# Patient Record
Sex: Female | Born: 1945 | Race: White | Hispanic: No | Marital: Married | Smoking: Former smoker
Health system: Southern US, Community
[De-identification: ages and names within clinical notes are randomized; demographics above are authoritative.]

## PROBLEM LIST (undated history)

## (undated) DIAGNOSIS — I10 Essential (primary) hypertension: Secondary | ICD-10-CM

## (undated) DIAGNOSIS — E119 Type 2 diabetes mellitus without complications: Secondary | ICD-10-CM

## (undated) DIAGNOSIS — E785 Hyperlipidemia, unspecified: Secondary | ICD-10-CM

## (undated) HISTORY — DX: Essential (primary) hypertension: I10

## (undated) HISTORY — DX: Type 2 diabetes mellitus without complications: E11.9

## (undated) HISTORY — DX: Hyperlipidemia, unspecified: E78.5

---

## 1981-10-22 HISTORY — PX: TUBAL LIGATION: SHX77

## 2005-08-23 ENCOUNTER — Ambulatory Visit: Payer: Self-pay | Admitting: Family Medicine

## 2005-09-01 ENCOUNTER — Ambulatory Visit: Payer: Self-pay | Admitting: Unknown Physician Specialty

## 2005-09-22 ENCOUNTER — Ambulatory Visit: Payer: Self-pay | Admitting: Unknown Physician Specialty

## 2006-09-05 ENCOUNTER — Ambulatory Visit: Payer: Self-pay

## 2007-09-11 ENCOUNTER — Ambulatory Visit: Payer: Self-pay

## 2008-09-14 ENCOUNTER — Ambulatory Visit: Payer: Self-pay

## 2008-09-23 ENCOUNTER — Ambulatory Visit: Payer: Self-pay | Admitting: Certified Nurse Midwife

## 2009-09-26 ENCOUNTER — Ambulatory Visit: Payer: Self-pay

## 2010-07-13 ENCOUNTER — Ambulatory Visit: Payer: Self-pay | Admitting: Gastroenterology

## 2010-09-27 ENCOUNTER — Ambulatory Visit: Payer: Self-pay

## 2010-11-16 ENCOUNTER — Ambulatory Visit: Payer: Self-pay

## 2011-10-02 ENCOUNTER — Ambulatory Visit: Payer: Self-pay

## 2012-10-02 ENCOUNTER — Ambulatory Visit: Payer: Self-pay

## 2013-10-05 ENCOUNTER — Ambulatory Visit: Payer: Self-pay

## 2013-10-21 ENCOUNTER — Ambulatory Visit: Payer: Self-pay

## 2013-10-22 HISTORY — PX: ROTATOR CUFF REPAIR: SHX139

## 2013-10-29 ENCOUNTER — Ambulatory Visit: Payer: Self-pay | Admitting: Unknown Physician Specialty

## 2013-11-26 ENCOUNTER — Ambulatory Visit: Payer: Self-pay | Admitting: Anesthesiology

## 2013-11-27 ENCOUNTER — Ambulatory Visit: Payer: Self-pay | Admitting: Unknown Physician Specialty

## 2013-12-29 ENCOUNTER — Encounter: Payer: Self-pay | Admitting: Unknown Physician Specialty

## 2014-01-20 ENCOUNTER — Encounter: Payer: Self-pay | Admitting: Unknown Physician Specialty

## 2014-02-19 ENCOUNTER — Encounter: Payer: Self-pay | Admitting: Unknown Physician Specialty

## 2014-05-06 DIAGNOSIS — M75100 Unspecified rotator cuff tear or rupture of unspecified shoulder, not specified as traumatic: Secondary | ICD-10-CM | POA: Insufficient documentation

## 2014-06-02 DIAGNOSIS — E119 Type 2 diabetes mellitus without complications: Secondary | ICD-10-CM | POA: Insufficient documentation

## 2014-10-07 ENCOUNTER — Ambulatory Visit: Payer: Self-pay

## 2015-04-20 ENCOUNTER — Ambulatory Visit (INDEPENDENT_AMBULATORY_CARE_PROVIDER_SITE_OTHER): Payer: Medicare Other | Admitting: Urology

## 2015-04-20 ENCOUNTER — Encounter: Payer: Self-pay | Admitting: Urology

## 2015-04-20 VITALS — BP 124/69 | HR 77 | Ht 66.0 in | Wt 190.0 lb

## 2015-04-20 DIAGNOSIS — I10 Essential (primary) hypertension: Secondary | ICD-10-CM | POA: Insufficient documentation

## 2015-04-20 DIAGNOSIS — N390 Urinary tract infection, site not specified: Secondary | ICD-10-CM

## 2015-04-20 DIAGNOSIS — N8111 Cystocele, midline: Secondary | ICD-10-CM

## 2015-04-20 DIAGNOSIS — E782 Mixed hyperlipidemia: Secondary | ICD-10-CM | POA: Insufficient documentation

## 2015-04-20 DIAGNOSIS — R8281 Pyuria: Secondary | ICD-10-CM | POA: Insufficient documentation

## 2015-04-20 LAB — URINALYSIS, COMPLETE
Bilirubin, UA: NEGATIVE
GLUCOSE, UA: NEGATIVE
KETONES UA: NEGATIVE
Nitrite, UA: NEGATIVE
PROTEIN UA: NEGATIVE
Specific Gravity, UA: 1.02 (ref 1.005–1.030)
Urobilinogen, Ur: 0.2 mg/dL (ref 0.2–1.0)
pH, UA: 6 (ref 5.0–7.5)

## 2015-04-20 LAB — MICROSCOPIC EXAMINATION

## 2015-04-20 LAB — BLADDER SCAN AMB NON-IMAGING

## 2015-04-20 NOTE — Progress Notes (Signed)
H&P  Chief Complaint: UTI   History of Present Illness: Amber Davila is a 69 y.o. year old female who comes in today, referred for urinary tract infections. She's been treated for several of these over the past few months. These are typically without symptoms-frequency, urgency, dysuria, fever, chills or pelvic pressure. Occasionally, she has strong smelling urine. She gets anti-biotics from her physician when she is found to have pyuria, even though she is asymptomatic. Cultures have grown beta hemolytic strep. She is not aware of having had any imaging of her upper urinary tract. She does have a cystocele, that really is not bothersome. She has a good stream and usually feels like she empties well. She has minimal urinary incontinence. Despite having a cystocele that is significant to her, this does not preclude intercourse. She does not have dyspareunia. She denies any bowel issues.  Past Medical History  Diagnosis Date  . Diabetes   . Hyperlipemia   . Hypertension     Past Surgical History  Procedure Laterality Date  . Tubal ligation  1983  . Rotator cuff repair Left 2015    Home Medications:   (Not in a hospital admission)  Allergies:  Allergies  Allergen Reactions  . Codeine     Other reaction(s): Other (See Comments) Causes chest pain  . Lisinopril-Hydrochlorothiazide Nausea Only    Other reaction(s): Cough  . Nitrofurantoin     Other reaction(s): Other (See Comments) Made her sick    Family History  Problem Relation Age of Onset  . Kidney cancer Mother     Social History:  reports that she quit smoking about 36 years ago. Her smoking use included Cigarettes. She smoked 0.50 packs per day. She does not have any smokeless tobacco history on file. She reports that she does not drink alcohol or use illicit drugs.  ROS: A complete review of systems was performed.  All systems are negative except for pertinent findings as noted.  Physical Exam:  Vital signs in  last 24 hours: @VSRANGES @ General:  Alert and oriented, No acute distress HEENT: Normocephalic, atraumatic Neck: No JVD or lymphadenopathy Cardiovascular: Regular rate and rhythm Lungs: Clear bilaterally Abdomen: Soft, nontender, nondistended, no abdominal masses. Mildly obese Back: No CVA tenderness Extremities: No edema Neurologic: Grossly intact  Laboratory Data:  Urinalysis revealed multiple white cells, few bacteria. No significant red cells.  Residual urine volume 25 mL. I'm not aware of any renal ultrasounds having been performed in the past Creatinine: No results for input(s): CREATININE in the last 168 hours.  Radiologic Imaging: No results found.  Impression/Assessment:  1. History of pyuria. This is asymptomatic. In this 69 year old female, I do not think anabiotic management is necessary unless she has symptomatic urinary tract infections i.e. dysuria, frequency or urgency associated with infected looking urine  2. Cystocele  Plan:  1. I reassured the patient that I do not think any further anabiotic management is necessary unless she has symptoms with her pyuria. Additionally, the strep that she has from her culture results is most likely external in nature.  2. I will have a renal ultrasound performed to assure ourselves that her upper tracts look fine  3. If that looks fine-she will come in when necessary  Chelsea AusDAHLSTEDT, Nicanor Mendolia M 04/20/2015, 10:56 AM  Bertram MillardStephen M. Coleston Dirosa MD

## 2015-04-29 ENCOUNTER — Other Ambulatory Visit: Payer: Self-pay | Admitting: Family Medicine

## 2015-04-29 DIAGNOSIS — R8281 Pyuria: Secondary | ICD-10-CM

## 2015-04-29 DIAGNOSIS — N390 Urinary tract infection, site not specified: Secondary | ICD-10-CM

## 2015-05-06 ENCOUNTER — Ambulatory Visit
Admission: RE | Admit: 2015-05-06 | Discharge: 2015-05-06 | Disposition: A | Discharge status: Home / Self Care | Payer: Medicare Other | Source: Ambulatory Visit | Attending: Urology | Admitting: Urology

## 2015-05-06 DIAGNOSIS — R8281 Pyuria: Secondary | ICD-10-CM

## 2015-05-06 DIAGNOSIS — N390 Urinary tract infection, site not specified: Secondary | ICD-10-CM | POA: Diagnosis present

## 2015-08-23 ENCOUNTER — Other Ambulatory Visit: Payer: Self-pay | Admitting: Obstetrics and Gynecology

## 2015-08-23 DIAGNOSIS — Z1231 Encounter for screening mammogram for malignant neoplasm of breast: Secondary | ICD-10-CM

## 2015-10-10 ENCOUNTER — Ambulatory Visit
Admission: RE | Admit: 2015-10-10 | Discharge: 2015-10-10 | Disposition: A | Discharge status: Home / Self Care | Payer: Medicare Other | Source: Ambulatory Visit | Attending: Obstetrics and Gynecology | Admitting: Obstetrics and Gynecology

## 2015-10-10 ENCOUNTER — Other Ambulatory Visit: Payer: Self-pay | Admitting: Obstetrics and Gynecology

## 2015-10-10 DIAGNOSIS — Z1231 Encounter for screening mammogram for malignant neoplasm of breast: Secondary | ICD-10-CM

## 2017-11-07 ENCOUNTER — Other Ambulatory Visit: Payer: Self-pay | Admitting: Family Medicine

## 2017-11-07 DIAGNOSIS — Z1231 Encounter for screening mammogram for malignant neoplasm of breast: Secondary | ICD-10-CM

## 2017-11-25 ENCOUNTER — Ambulatory Visit
Admission: RE | Admit: 2017-11-25 | Discharge: 2017-11-25 | Disposition: A | Discharge status: Home / Self Care | Payer: Medicare Other | Source: Ambulatory Visit | Attending: Family Medicine | Admitting: Family Medicine

## 2017-11-25 DIAGNOSIS — Z1231 Encounter for screening mammogram for malignant neoplasm of breast: Secondary | ICD-10-CM | POA: Insufficient documentation

## 2020-06-24 ENCOUNTER — Other Ambulatory Visit: Payer: Self-pay | Admitting: Family Medicine

## 2020-06-24 DIAGNOSIS — Z1231 Encounter for screening mammogram for malignant neoplasm of breast: Secondary | ICD-10-CM

## 2020-07-12 ENCOUNTER — Ambulatory Visit
Admission: RE | Admit: 2020-07-12 | Discharge: 2020-07-12 | Disposition: A | Discharge status: Home / Self Care | Payer: Medicare PPO | Source: Ambulatory Visit | Attending: Family Medicine | Admitting: Family Medicine

## 2020-07-12 DIAGNOSIS — Z1231 Encounter for screening mammogram for malignant neoplasm of breast: Secondary | ICD-10-CM | POA: Insufficient documentation

## 2021-06-16 ENCOUNTER — Other Ambulatory Visit: Payer: Self-pay | Admitting: Family Medicine

## 2021-06-16 DIAGNOSIS — Z1231 Encounter for screening mammogram for malignant neoplasm of breast: Secondary | ICD-10-CM

## 2021-07-13 ENCOUNTER — Ambulatory Visit
Admission: RE | Admit: 2021-07-13 | Discharge: 2021-07-13 | Disposition: A | Discharge status: Home / Self Care | Payer: Medicare PPO | Source: Ambulatory Visit | Attending: Family Medicine | Admitting: Family Medicine

## 2021-07-13 ENCOUNTER — Other Ambulatory Visit: Payer: Self-pay

## 2021-07-13 DIAGNOSIS — Z1231 Encounter for screening mammogram for malignant neoplasm of breast: Secondary | ICD-10-CM | POA: Insufficient documentation

## 2021-11-10 ENCOUNTER — Encounter
Admission: RE | Disposition: A | Discharge status: Home / Self Care | Payer: Self-pay | Source: Home / Self Care | Attending: Gastroenterology

## 2021-11-10 ENCOUNTER — Ambulatory Visit: Payer: Medicare PPO | Admitting: Anesthesiology

## 2021-11-10 ENCOUNTER — Encounter: Payer: Self-pay | Admitting: *Deleted

## 2021-11-10 ENCOUNTER — Ambulatory Visit
Admission: RE | Admit: 2021-11-10 | Discharge: 2021-11-10 | Disposition: A | Discharge status: Home / Self Care | Payer: Medicare PPO | Attending: Gastroenterology | Admitting: Gastroenterology

## 2021-11-10 DIAGNOSIS — K5730 Diverticulosis of large intestine without perforation or abscess without bleeding: Secondary | ICD-10-CM | POA: Diagnosis not present

## 2021-11-10 DIAGNOSIS — K640 First degree hemorrhoids: Secondary | ICD-10-CM | POA: Insufficient documentation

## 2021-11-10 DIAGNOSIS — I10 Essential (primary) hypertension: Secondary | ICD-10-CM | POA: Diagnosis not present

## 2021-11-10 DIAGNOSIS — Z87891 Personal history of nicotine dependence: Secondary | ICD-10-CM | POA: Diagnosis not present

## 2021-11-10 DIAGNOSIS — Z1211 Encounter for screening for malignant neoplasm of colon: Secondary | ICD-10-CM | POA: Diagnosis present

## 2021-11-10 DIAGNOSIS — E119 Type 2 diabetes mellitus without complications: Secondary | ICD-10-CM | POA: Diagnosis not present

## 2021-11-10 HISTORY — PX: COLONOSCOPY: SHX5424

## 2021-11-10 LAB — GLUCOSE, CAPILLARY: Glucose-Capillary: 96 mg/dL (ref 70–99)

## 2021-11-10 SURGERY — COLONOSCOPY
Anesthesia: General

## 2021-11-10 MED ORDER — PROPOFOL 500 MG/50ML IV EMUL
INTRAVENOUS | Status: DC | PRN
  Administered 2021-11-10: 140 ug/kg/min via INTRAVENOUS

## 2021-11-10 MED ORDER — LIDOCAINE HCL (CARDIAC) PF 100 MG/5ML IV SOSY
PREFILLED_SYRINGE | INTRAVENOUS | Status: DC | PRN
  Administered 2021-11-10: 50 mg via INTRAVENOUS

## 2021-11-10 MED ORDER — SODIUM CHLORIDE 0.9 % IV SOLN
INTRAVENOUS | Status: DC
  Administered 2021-11-10: 20 mL/h via INTRAVENOUS

## 2021-11-10 MED ORDER — PROPOFOL 10 MG/ML IV BOLUS
INTRAVENOUS | Status: DC | PRN
  Administered 2021-11-10: 50 mg via INTRAVENOUS

## 2021-11-10 NOTE — Transfer of Care (Signed)
Immediate Anesthesia Transfer of Care Note  Patient: Amber Davila  Procedure(s) Performed: COLONOSCOPY  Patient Location: Endoscopy Unit  Anesthesia Type:General  Level of Consciousness: sedated  Airway & Oxygen Therapy: Patient Spontanous Breathing  Post-op Assessment: Report given to RN and Post -op Vital signs reviewed and stable  Post vital signs: Reviewed and stable  Last Vitals:  Vitals Value Taken Time  BP 119/57 11/10/21 1018  Temp    Pulse 72 11/10/21 1018  Resp 17 11/10/21 1018  SpO2 94 % 11/10/21 1018  Vitals shown include unvalidated device data.  Last Pain:  Vitals:   11/10/21 0908  TempSrc: Temporal  PainSc: 0-No pain         Complications: No notable events documented.

## 2021-11-10 NOTE — H&P (Signed)
Outpatient short stay form Pre-procedure 11/10/2021  Regis Bill, MD  Primary Physician: Marisue Ivan, MD  Reason for visit:  Screening colonoscopy  History of present illness:    76 y/o lady with history of hypertension here for screening colonoscopy. Last colonoscopy was in 2011 and was normal. Possible mention of adenomatous polyps but patient denies history of polyps and no records to suggest history of polyps. No blood thinners. No significant abdominal surgeries.    Current Facility-Administered Medications:    0.9 %  sodium chloride infusion, , Intravenous, Continuous, Alicen Donalson, Rossie Muskrat, MD, Last Rate: 20 mL/hr at 11/10/21 0945, Continued from Pre-op at 11/10/21 0945  Medications Prior to Admission  Medication Sig Dispense Refill Last Dose   amLODipine (NORVASC) 10 MG tablet Take 10 mg by mouth daily.   Past Week   aspirin EC 81 MG tablet Take by mouth.   Past Week   Calcium-Magnesium-Vitamin D (CALCIUM 500 PO) Take by mouth.   Past Week   Cyanocobalamin 1000 MCG TBCR Take by mouth.   Past Week   diclofenac Sodium (VOLTAREN) 1 % GEL Apply topically 4 (four) times daily.   Past Week   fenofibrate micronized (LOFIBRA) 134 MG capsule TAKE 1 CAPSULE BY MOUTH ONCE A DAY   Past Week   glimepiride (AMARYL) 1 MG tablet TAKE 1 TABLET (1 MG TOTAL) BY MOUTH DAILY WITH BREAKFAST.   Past Week   hydrochlorothiazide (MICROZIDE) 12.5 MG capsule TAKE ONE CAPSULE BY MOUTH EVERY DAY   Past Week   losartan (COZAAR) 25 MG tablet TAKE 1 TABLET BY MOUTH EVERY DAY   Past Week   pravastatin (PRAVACHOL) 20 MG tablet Take 20 mg by mouth daily.   Past Week   rOPINIRole (REQUIP) 0.25 MG tablet Take 0.25 mg by mouth 3 (three) times daily.   Past Week     Allergies  Allergen Reactions   Bactrim [Sulfamethoxazole-Trimethoprim]    Codeine     Other reaction(s): Other (See Comments) Causes chest pain   Lisinopril-Hydrochlorothiazide Nausea Only    Other reaction(s): Cough   Losartan     Nitrofurantoin     Other reaction(s): Other (See Comments) Made her sick     Past Medical History:  Diagnosis Date   Diabetes (HCC)    Hyperlipemia    Hypertension     Review of systems:  Otherwise negative.    Physical Exam  Gen: Alert, oriented. Appears stated age.  HEENT: PERRLA. Lungs: No respiratory distress CV: RRR Abd: soft, benign, no masses Ext: No edema    Planned procedures: Proceed with colonoscopy. The patient understands the nature of the planned procedure, indications, risks, alternatives and potential complications including but not limited to bleeding, infection, perforation, damage to internal organs and possible oversedation/side effects from anesthesia. The patient agrees and gives consent to proceed.  Please refer to procedure notes for findings, recommendations and patient disposition/instructions.     Regis Bill, MD [facility]

## 2021-11-10 NOTE — Op Note (Signed)
[facility] Patient Name: Amber Davila Procedure Date: 11/10/2021 9:43 AM MRN: [id number] Account #: [id number] Date of Birth: 1946-06-19 Admit Type: Outpatient Age: 76 Room: [facility] ENDO ROOM 3 Gender: Female Note Status: Finalized Instrument Name: Park Meo [id number] Procedure:             Colonoscopy Indications:           Screening for colorectal malignant neoplasm Providers:             Andrey Farmer MD, MD Referring MD:          Dion Body (Referring MD) Medicines:             Monitored Anesthesia Care Complications:         No immediate complications. Procedure:             Pre-Anesthesia Assessment:                        - Prior to the procedure, a History and Physical was                         performed, and patient medications and allergies were                         reviewed. The patient is competent. The risks and                         benefits of the procedure and the sedation options and                         risks were discussed with the patient. All questions                         were answered and informed consent was obtained.                         Patient identification and proposed procedure were                         verified by the physician, the nurse, the                         anesthesiologist, the anesthetist and the technician                         in the endoscopy suite. Mental Status Examination:                         alert and oriented. Airway Examination: normal                         oropharyngeal airway and neck mobility. Respiratory                         Examination: clear to auscultation. CV Examination:                         normal. Prophylactic Antibiotics: The patient does not  require prophylactic antibiotics. Prior                         Anticoagulants: The patient has taken no previous                         anticoagulant or antiplatelet agents.  ASA Grade                         Assessment: II - A patient with mild systemic disease.                         After reviewing the risks and benefits, the patient                         was deemed in satisfactory condition to undergo the                         procedure. The anesthesia plan was to use monitored                         anesthesia care (MAC). Immediately prior to                         administration of medications, the patient was                         re-assessed for adequacy to receive sedatives. The                         heart rate, respiratory rate, oxygen saturations,                         blood pressure, adequacy of pulmonary ventilation, and                         response to care were monitored throughout the                         procedure. The physical status of the patient was                         re-assessed after the procedure.                        After obtaining informed consent, the colonoscope was                         passed under direct vision. Throughout the procedure,                         the patient's blood pressure, pulse, and oxygen                         saturations were monitored continuously. The                         Colonoscope was introduced through the anus and  advanced to the the cecum, identified by appendiceal                         orifice and ileocecal valve. The colonoscopy was                         performed without difficulty. The patient tolerated                         the procedure well. The quality of the bowel                         preparation was good. Findings:      The perianal and digital rectal examinations were normal.      Multiple small-mouthed diverticula were found in the sigmoid colon and       descending colon.      Internal hemorrhoids were found during retroflexion. The hemorrhoids       were Grade I (internal hemorrhoids that do not prolapse).      The exam  was otherwise without abnormality on direct and retroflexion       views. Impression:            - Diverticulosis in the sigmoid colon and in the                         descending colon.                        - Internal hemorrhoids.                        - The examination was otherwise normal on direct and                         retroflexion views.                        - No specimens collected. Recommendation:        - Discharge patient to home.                        - Resume previous diet.                        - Continue present medications.                        - Repeat colonoscopy is not recommended due to current                         age (37 years or older) for screening purposes.                        - Return to referring physician as previously                         scheduled. Procedure Code(s):     --- Professional ---                        P5093, Colorectal cancer screening; colonoscopy on  individual not meeting criteria for high risk Diagnosis Code(s):     --- Professional ---                        Z12.11, Encounter for screening for malignant neoplasm                         of colon                        K64.0, First degree hemorrhoids                        K57.30, Diverticulosis of large intestine without                         perforation or abscess without bleeding CPT copyright 2019 [employer] Medical Association. All rights reserved. The codes documented in this report are preliminary and upon coder review may  be revised to meet current compliance requirements. Andrey Farmer MD, MD 11/10/2021 10:19:46 AM Number of Addenda: 0 Note Initiated On: 11/10/2021 9:43 AM Scope Withdrawal Time: 0 hours 9 minutes 24 seconds  Total Procedure Duration: 0 hours 15 minutes 43 seconds  Estimated Blood Loss:  Estimated blood loss: none.      [facility]

## 2021-11-10 NOTE — Anesthesia Preprocedure Evaluation (Signed)
Anesthesia Evaluation  Patient identified by MRN, date of birth, ID band Patient awake    Reviewed: Allergy & Precautions, NPO status , Patient's Chart, lab work & pertinent test results  History of Anesthesia Complications Negative for: history of anesthetic complications  Airway Mallampati: III  TM Distance: <3 FB Neck ROM: full    Dental  (+) Upper Dentures, Lower Dentures   Pulmonary neg shortness of breath, former smoker,    Pulmonary exam normal        Cardiovascular hypertension, (-) angina(-) DOE Normal cardiovascular exam     Neuro/Psych negative neurological ROS  negative psych ROS   GI/Hepatic negative GI ROS, Neg liver ROS, neg GERD  ,  Endo/Other  diabetes, Type 2  Renal/GU negative Renal ROS  negative genitourinary   Musculoskeletal   Abdominal   Peds  Hematology negative hematology ROS (+)   Anesthesia Other Findings Past Medical History: No date: Diabetes (HCC) No date: Hyperlipemia No date: Hypertension  Past Surgical History: 2015: ROTATOR CUFF REPAIR; Left 1983: TUBAL LIGATION  BMI    Body Mass Index: 31.47 kg/m      Reproductive/Obstetrics negative OB ROS                             Anesthesia Physical Anesthesia Plan  ASA: 3  Anesthesia Plan: General   Post-op Pain Management:    Induction: Intravenous  PONV Risk Score and Plan: Propofol infusion and TIVA  Airway Management Planned: Natural Airway and Nasal Cannula  Additional Equipment:   Intra-op Plan:   Post-operative Plan:   Informed Consent: I have reviewed the patients History and Physical, chart, labs and discussed the procedure including the risks, benefits and alternatives for the proposed anesthesia with the patient or authorized representative who has indicated his/her understanding and acceptance.     Dental Advisory Given  Plan Discussed with: Anesthesiologist, CRNA and  Surgeon  Anesthesia Plan Comments: (Patient consented for risks of anesthesia including but not limited to:  - adverse reactions to medications - risk of airway placement if required - damage to eyes, teeth, lips or other oral mucosa - nerve damage due to positioning  - sore throat or hoarseness - Damage to heart, brain, nerves, lungs, other parts of body or loss of life  Patient voiced understanding.)        Anesthesia Quick Evaluation

## 2021-11-10 NOTE — Anesthesia Postprocedure Evaluation (Signed)
Anesthesia Post Note  Patient: Amber Davila  Procedure(s) Performed: COLONOSCOPY  Patient location during evaluation: Endoscopy Anesthesia Type: General Level of consciousness: awake and alert Pain management: pain level controlled Vital Signs Assessment: post-procedure vital signs reviewed and stable Respiratory status: spontaneous breathing, nonlabored ventilation, respiratory function stable and patient connected to nasal cannula oxygen Cardiovascular status: blood pressure returned to baseline and stable Postop Assessment: no apparent nausea or vomiting Anesthetic complications: no   No notable events documented.   Last Vitals:  Vitals:   11/10/21 1040 11/10/21 1043  BP: (!) 125/91 124/75  Pulse: 72 72  Resp: 20 14  Temp:    SpO2: 100% 100%    Last Pain:  Vitals:   11/10/21 1018  TempSrc:   PainSc: Asleep                 Precious Haws [name]

## 2021-11-10 NOTE — Anesthesia Procedure Notes (Signed)
Date/Time: 11/10/2021 9:54 AM Performed by: Joanette Gula, Yoselin Amerman, CRNA Pre-anesthesia Checklist: Patient identified, Emergency Drugs available, Suction available, Timeout performed and Patient being monitored Patient Re-evaluated:Patient Re-evaluated prior to induction Oxygen Delivery Method: Nasal cannula Induction Type: IV induction

## 2021-11-10 NOTE — Interval H&P Note (Signed)
History and Physical Interval Note:  11/10/2021 9:54 AM  Amber Davila  has presented today for surgery, with the diagnosis of CCA SCREEN.  The various methods of treatment have been discussed with the patient and family. After consideration of risks, benefits and other options for treatment, the patient has consented to  Procedure(s): COLONOSCOPY (N/A) as a surgical intervention.  The patient's history has been reviewed, patient examined, no change in status, stable for surgery.  I have reviewed the patient's chart and labs.  Questions were answered to the patient's satisfaction.     Regis Bill  Ok to proceed with colonoscopy

## 2021-11-13 ENCOUNTER — Encounter: Payer: Self-pay | Admitting: Gastroenterology

## 2021-12-15 IMAGING — MG MM DIGITAL SCREENING BILAT W/ TOMO AND CAD
6 of 10 series · 6 of 30 positions shown · non-contrast
Comparison: Previous exam(s).

CLINICAL DATA: Screening.

EXAM:
DIGITAL SCREENING BILATERAL MAMMOGRAM WITH TOMOSYNTHESIS AND CAD
TECHNIQUE: Bilateral screening digital craniocaudal and mediolateral oblique
mammograms were obtained. Bilateral screening digital breast
tomosynthesis was performed. The images were evaluated with
computer-aided detection.

[R CC synth-2D]
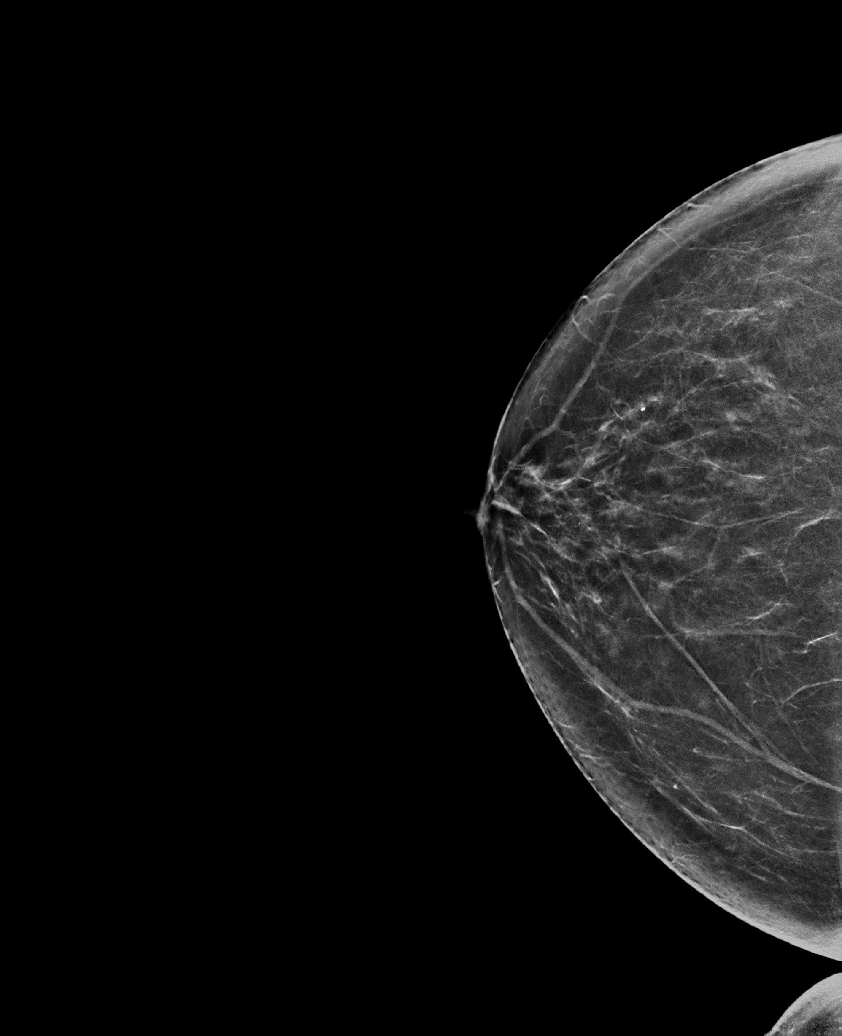

[L CC synth-2D]
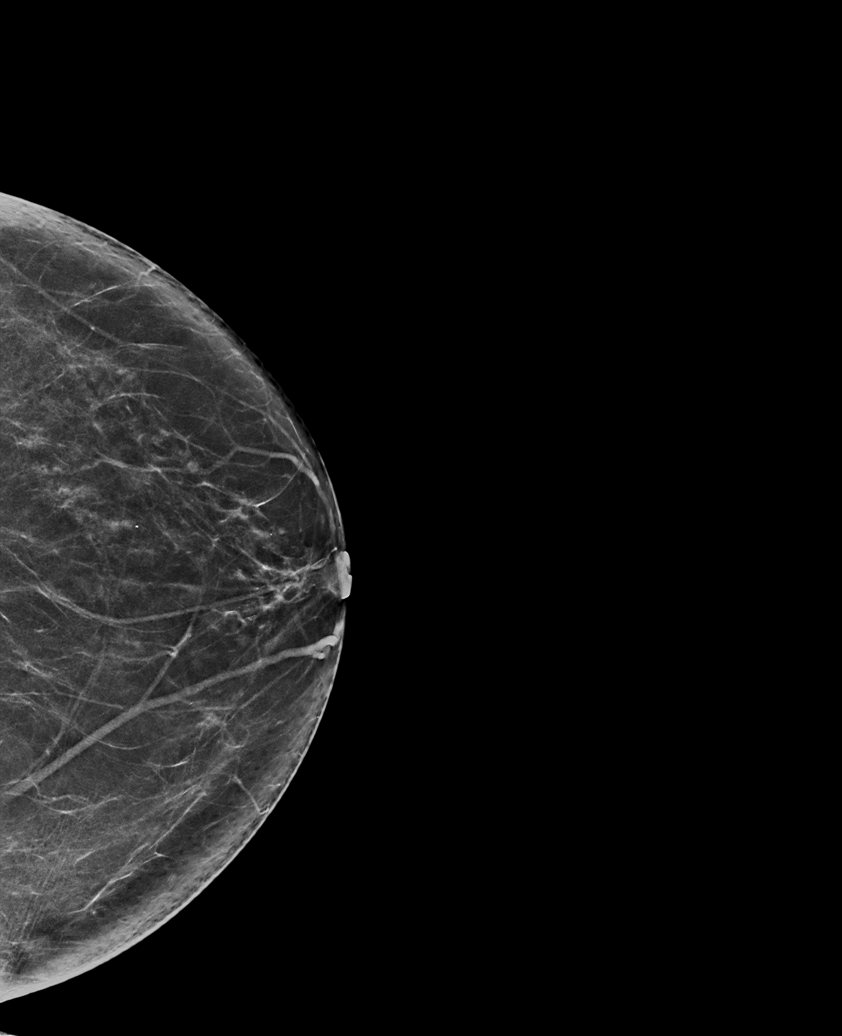

[R MLO synth-2D]
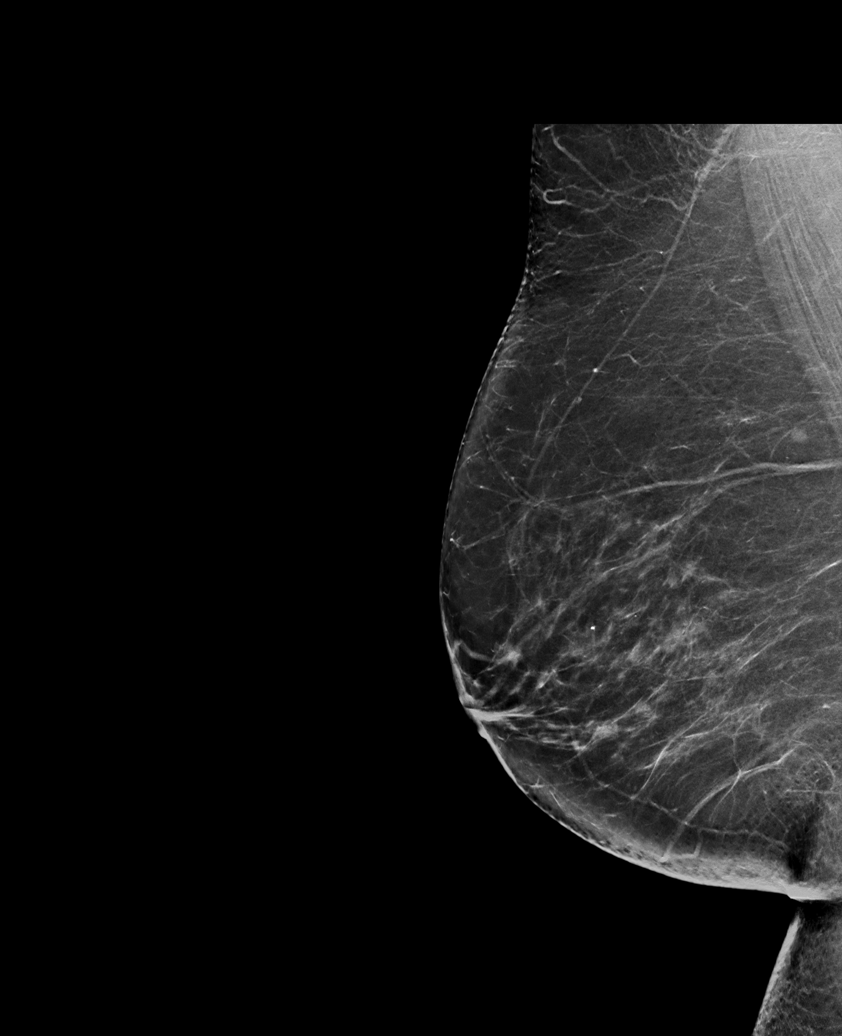

[L CV synth-2D]
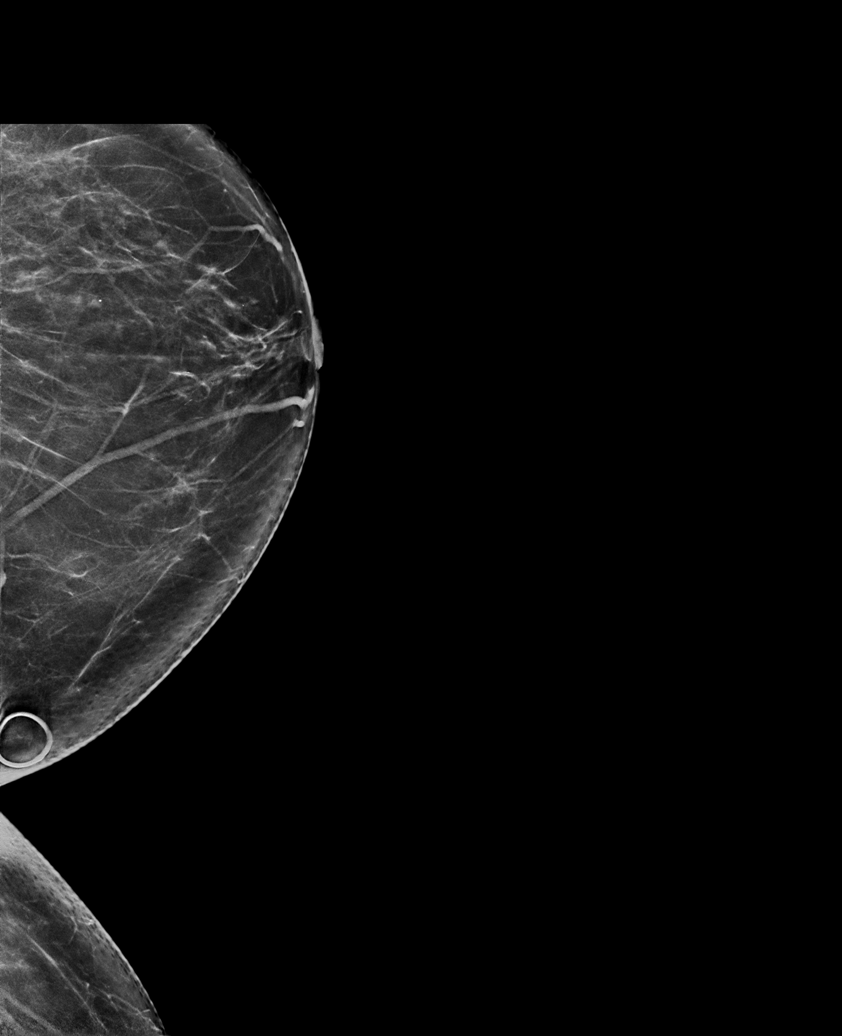

[L MLO synth-2D]
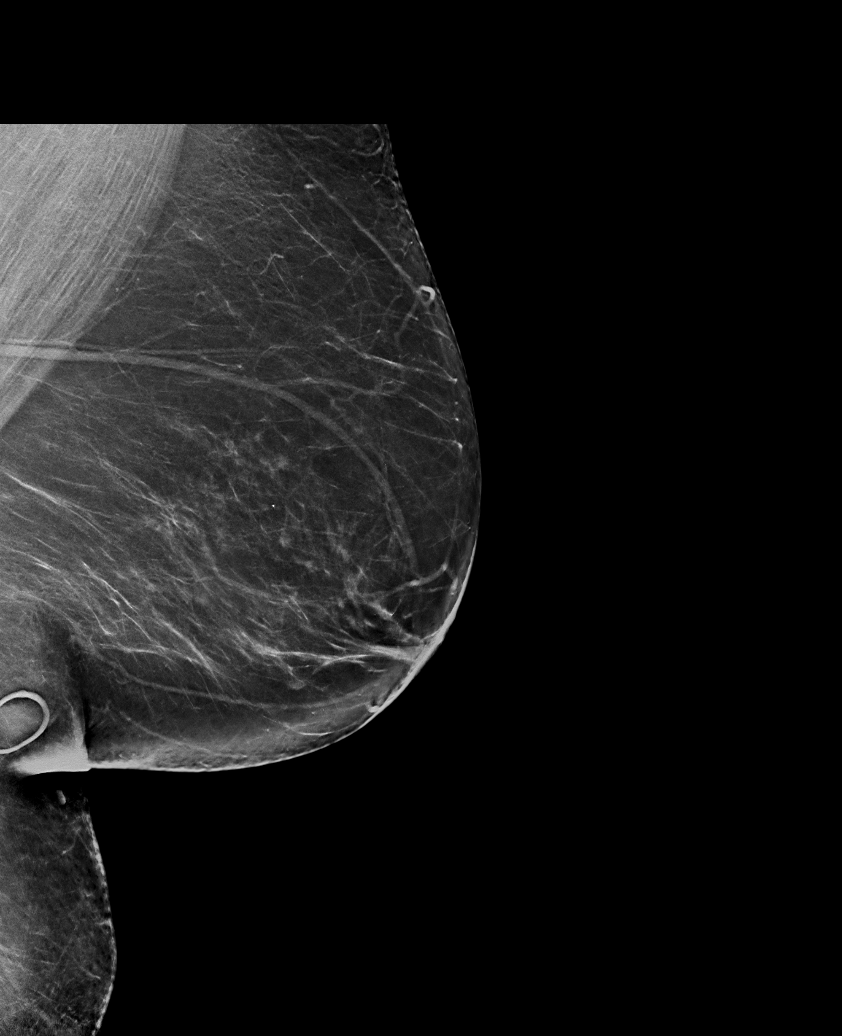

[L MLO tomo · tomo slice 43/85.0]
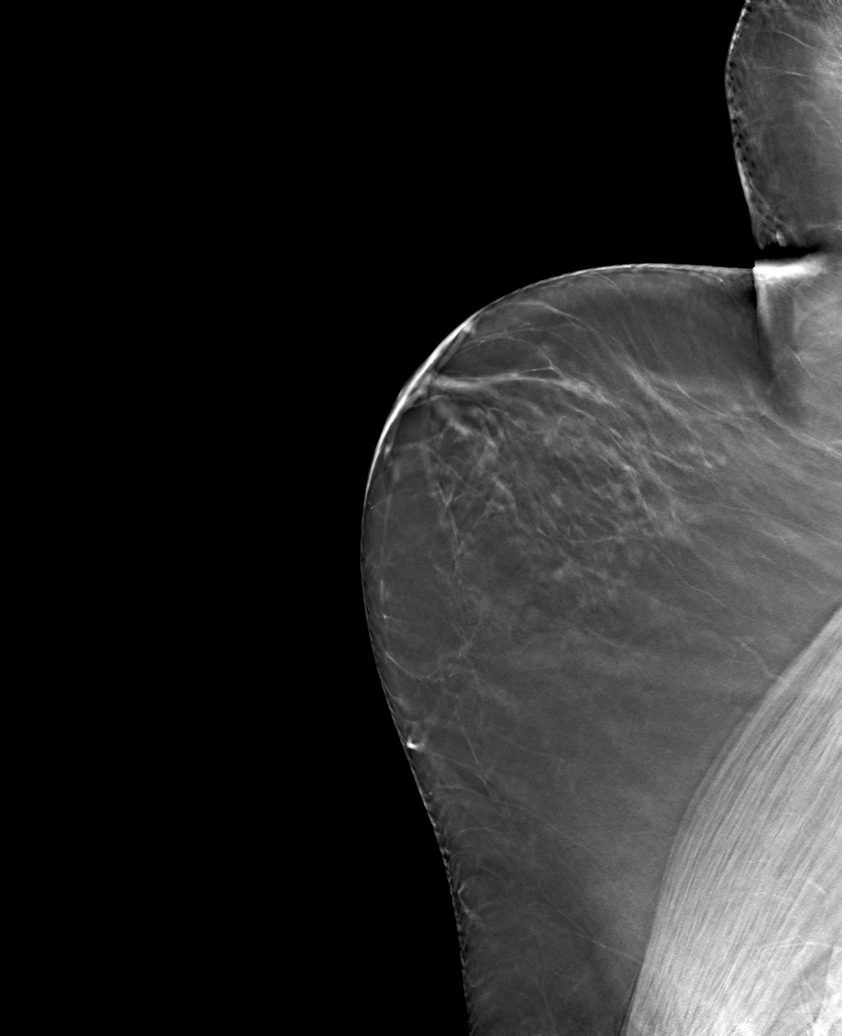

[6 of 30 positions shown; findings below may reference images not displayed]

ACR Breast Density Category b: There are scattered areas of
fibroglandular density.
FINDINGS: There are no findings suspicious for malignancy.
IMPRESSION: No mammographic evidence of malignancy. A result letter of this
screening mammogram will be mailed directly to the patient.

RECOMMENDATION:
Screening mammogram in one year. (Code:51-O-LD2)

BI-RADS CATEGORY  1: Negative.

## 2022-06-13 ENCOUNTER — Other Ambulatory Visit: Payer: Self-pay | Admitting: Family Medicine

## 2022-06-13 DIAGNOSIS — Z1231 Encounter for screening mammogram for malignant neoplasm of breast: Secondary | ICD-10-CM

## 2022-07-17 ENCOUNTER — Ambulatory Visit
Admission: RE | Admit: 2022-07-17 | Discharge: 2022-07-17 | Disposition: A | Discharge status: Home / Self Care | Payer: Medicare PPO | Source: Ambulatory Visit | Attending: Family Medicine | Admitting: Family Medicine

## 2022-07-17 DIAGNOSIS — Z1231 Encounter for screening mammogram for malignant neoplasm of breast: Secondary | ICD-10-CM | POA: Diagnosis not present

## 2024-10-29 ENCOUNTER — Other Ambulatory Visit: Payer: Self-pay | Admitting: Family Medicine

## 2024-10-29 DIAGNOSIS — Z1231 Encounter for screening mammogram for malignant neoplasm of breast: Secondary | ICD-10-CM

## 2024-11-26 ENCOUNTER — Ambulatory Visit
Admission: RE | Admit: 2024-11-26 | Discharge: 2024-11-26 | Disposition: A | Source: Ambulatory Visit | Attending: Family Medicine | Admitting: Family Medicine

## 2024-11-26 DIAGNOSIS — Z1231 Encounter for screening mammogram for malignant neoplasm of breast: Secondary | ICD-10-CM
